# Patient Record
Sex: Female | Born: 1955 | Race: White | Hispanic: No | Marital: Married | Smoking: Never smoker
Health system: Southern US, Community
[De-identification: ages and names within clinical notes are randomized; demographics above are authoritative.]

## PROBLEM LIST (undated history)

## (undated) DIAGNOSIS — E079 Disorder of thyroid, unspecified: Secondary | ICD-10-CM

## (undated) HISTORY — PX: APPENDECTOMY: SHX54

## (undated) HISTORY — DX: Disorder of thyroid, unspecified: E07.9

## (undated) HISTORY — PX: CHOLECYSTECTOMY: SHX55

---

## 2020-08-22 ENCOUNTER — Emergency Department (HOSPITAL_BASED_OUTPATIENT_CLINIC_OR_DEPARTMENT_OTHER): Payer: BC Managed Care – PPO

## 2020-08-22 ENCOUNTER — Other Ambulatory Visit: Payer: Self-pay

## 2020-08-22 ENCOUNTER — Encounter (HOSPITAL_BASED_OUTPATIENT_CLINIC_OR_DEPARTMENT_OTHER): Payer: Self-pay | Admitting: *Deleted

## 2020-08-22 ENCOUNTER — Emergency Department (HOSPITAL_BASED_OUTPATIENT_CLINIC_OR_DEPARTMENT_OTHER)
Admission: EM | Admit: 2020-08-22 | Discharge: 2020-08-22 | Disposition: A | Discharge status: Home / Self Care | Payer: BC Managed Care – PPO | Attending: Emergency Medicine | Admitting: Emergency Medicine

## 2020-08-22 DIAGNOSIS — U071 COVID-19: Secondary | ICD-10-CM | POA: Insufficient documentation

## 2020-08-22 DIAGNOSIS — J1282 Pneumonia due to coronavirus disease 2019: Secondary | ICD-10-CM | POA: Insufficient documentation

## 2020-08-22 DIAGNOSIS — R0602 Shortness of breath: Secondary | ICD-10-CM | POA: Diagnosis present

## 2020-08-22 NOTE — ED Triage Notes (Signed)
Pt reports + covid test last week, states she presents today "to get my lungs checked". Pt denies sob, reports dry cough, and "hiccoughs" intermittently. Rt at triage to assess.

## 2020-08-22 NOTE — ED Notes (Signed)
ED Provider at bedside. 

## 2020-08-22 NOTE — ED Provider Notes (Signed)
[facility] EMERGENCY DEPARTMENT Provider Note   CSN: [id number] Arrival date & time: 08/22/20  0941     History Chief Complaint  Patient presents with  . Shortness of Breath    Sara Ayala is a 64 y.o. female.  64 yo F with a chief complaints of shortness of breath.  Patient states that when she tries to take a deep inhale she feels like she has trouble.  She was diagnosed with the coronavirus about 2 weeks ago.  Estimates she had symptoms about 3 days before that.  She has had persistent fevers and cough.  Is also had some diarrhea off and on.  No continued nausea or vomiting.  No abdominal pain.  The history is provided by the patient.  Shortness of Breath Severity:  Moderate Onset quality:  Gradual Duration:  2 weeks Timing:  Constant Progression:  Worsening Chronicity:  New Relieved by:  Nothing Worsened by:  Nothing Ineffective treatments:  None tried Associated symptoms: cough and fever   Associated symptoms: no chest pain, no headaches, no vomiting and no wheezing        Past Medical History:  Diagnosis Date  . Thyroid disease     There are no problems to display for this patient.   Past Surgical History:  Procedure Laterality Date  . APPENDECTOMY    . CHOLECYSTECTOMY       OB History   No obstetric history on file.     No family history on file.  Social History   Tobacco Use  . Smoking status: Never Smoker  . Smokeless tobacco: Never Used  Substance Use Topics  . Alcohol use: Not on file  . Drug use: Not on file    Home Medications Prior to Admission medications   Not on File    Allergies    Patient has no known allergies.  Review of Systems   Review of Systems  Constitutional: Positive for chills and fever.  HENT: Negative for congestion and rhinorrhea.   Eyes: Negative for redness and visual disturbance.  Respiratory: Positive for cough and shortness of breath. Negative for wheezing.   Cardiovascular: Negative for  chest pain and palpitations.  Gastrointestinal: Positive for diarrhea. Negative for nausea and vomiting.  Genitourinary: Negative for dysuria and urgency.  Musculoskeletal: Negative for arthralgias and myalgias.  Skin: Negative for pallor and wound.  Neurological: Negative for dizziness and headaches.    Physical Exam Updated Vital Signs BP (!) 141/118 (BP Location: Left Arm)   Pulse (!) 107   Temp 98.7 F (37.1 C) (Oral)   Resp 18   Ht 5\' 2"  (1.575 m)   Wt 69.4 kg   SpO2 99%   BMI 27.98 kg/m   Physical Exam Vitals and nursing note reviewed.  Constitutional:      General: She is not in acute distress.    Appearance: She is well-developed. She is not diaphoretic.  HENT:     Head: Normocephalic and atraumatic.  Eyes:     Pupils: Pupils are equal, round, and reactive to light.  Cardiovascular:     Rate and Rhythm: Normal rate and regular rhythm.     Heart sounds: No murmur heard.  No friction rub. No gallop.   Pulmonary:     Effort: Pulmonary effort is normal.     Breath sounds: No wheezing or rales.  Abdominal:     General: There is no distension.     Palpations: Abdomen is soft.     Tenderness: There  is no abdominal tenderness.     Comments: Benign abdominal exam  Musculoskeletal:        General: No tenderness.     Cervical back: Normal range of motion and neck supple.  Skin:    General: Skin is warm and dry.  Neurological:     Mental Status: She is alert and oriented to person, place, and time.  Psychiatric:        Behavior: Behavior normal.     ED Results / Procedures / Treatments   Labs (all labs ordered are listed, but only abnormal results are displayed) Labs Reviewed - No data to display  EKG None  Radiology DG [facility] 1 View  Result Date: 08/22/2020 CLINICAL DATA:  Shortness of breath, COVID positive. EXAM: PORTABLE CHEST 1 VIEW COMPARISON:  None. FINDINGS: Patchy airspace opacities in the right lung with a peripheral predominance, most  conspicuous in the upper lung. Low lung volumes. No pleural effusions. No pneumothorax. Cardiac and mediastinal silhouette is within normal limits. No acute osseous abnormality. IMPRESSION: Patchy opacities in the right lung, concerning for pneumonia. Recommend follow-up to resolution. Electronically Signed   By: Feliberto Harts MD   On: 08/22/2020 11:19    Procedures Procedures (including critical care time)  Medications Ordered in ED Medications - No data to display  ED Course  I have reviewed the triage vital signs and the nursing notes.  Pertinent labs & imaging results that were available during my care of the patient were reviewed by me and considered in my medical decision making (see chart for details).    MDM Rules/Calculators/A&P                          64 yo F with a chief complaints of shortness of breath.  Patient was diagnosed with the novel coronavirus about 2 weeks ago and has had symptoms now for about 17 or 18 days.  She is well-appearing and nontoxic.  No tachypnea.  Was able to ambulate without hypoxia.  Will obtain a plain film of the chest.  PCP follow-up.  Sara Ayala was evaluated in Emergency Department on 08/22/2020 for the symptoms described in the history of present illness. He/she was evaluated in the context of the global COVID-19 pandemic, which necessitated consideration that the patient might be at risk for infection with the SARS-CoV-2 virus that causes COVID-19. Institutional protocols and algorithms that pertain to the evaluation of patients at risk for COVID-19 are in a state of rapid change based on information released by regulatory bodies including the CDC and federal and state organizations. These policies and algorithms were followed during the patient's care in the ED.  11:34 AM:  I have discussed the diagnosis/risks/treatment options with the patient and believe the pt to be eligible for discharge home to follow-up with PCP. We also discussed  returning to the ED immediately if new or worsening sx occur. We discussed the sx which are most concerning (e.g., sudden worsening pain, fever, inability to tolerate by mouth) that necessitate immediate return. Medications administered to the patient during their visit and any new prescriptions provided to the patient are listed below.  Medications given during this visit Medications - No data to display   The patient appears reasonably screen and/or stabilized for discharge and I doubt any other medical condition or other [facility] requiring further screening, evaluation, or treatment in the ED at this time prior to discharge.   Final Clinical Impression(s) /  ED Diagnoses Final diagnoses:  Pneumonia due to COVID-19 virus    Rx / DC Orders ED Discharge Orders    None       Melene Plan, DO 08/22/20 1134

## 2020-08-22 NOTE — Discharge Instructions (Signed)
Your chest x-ray showed that you have infiltrates that are consistent with the coronavirus.  Please follow-up with your family doctor.  Please return to the ED for worsening difficulty breathing.

## 2020-08-22 NOTE — ED Notes (Signed)
Patient ambulated form lobby to triage.  SpO2 98-100% on r/a,HR 110-115, RR 20s.  Shallow respirations

## 2022-02-02 IMAGING — DX DG CHEST 1V PORT
1 series · 1 of 1 positions shown · non-contrast
Comparison: None.

CLINICAL DATA: Shortness of breath, COVID positive.

EXAM:
PORTABLE CHEST 1 VIEW

[chest ap]
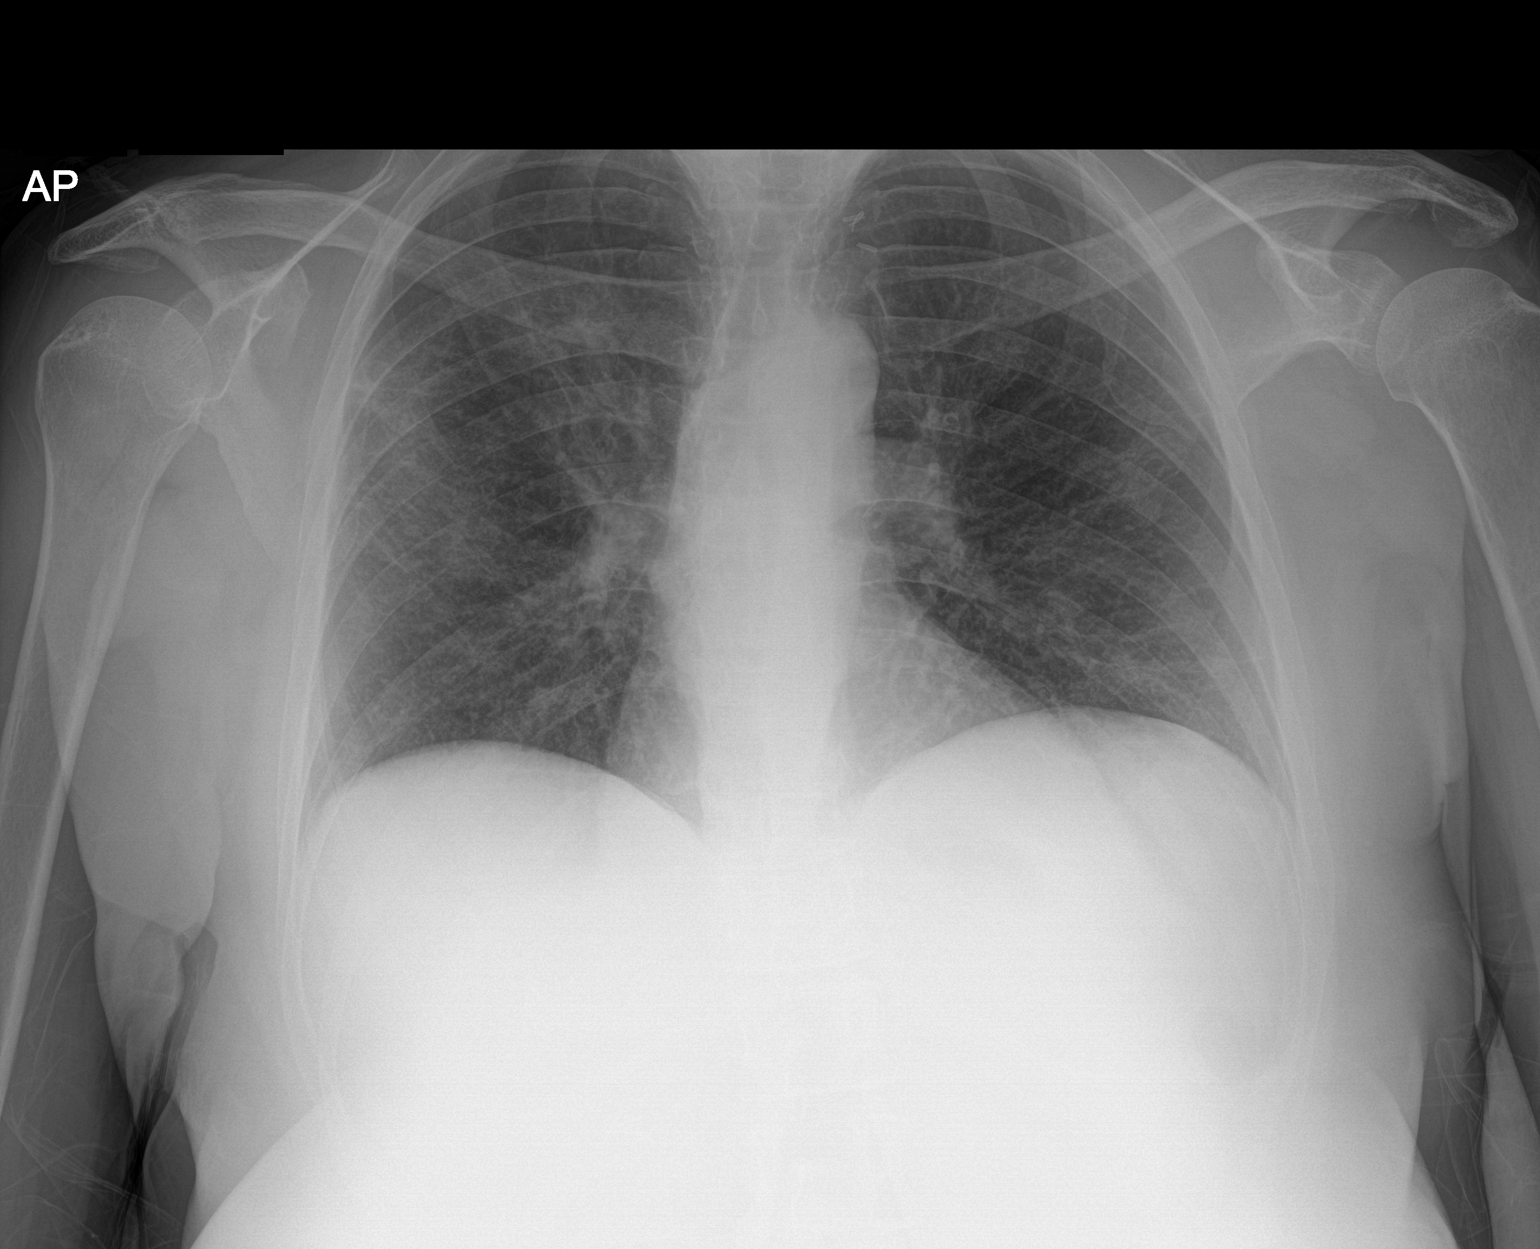

[1 of 1 positions shown; findings below may reference images not displayed]

FINDINGS: Patchy airspace opacities in the right lung with a peripheral
predominance, most conspicuous in the upper lung. Low lung volumes.
No pleural effusions. No pneumothorax. Cardiac and mediastinal
silhouette is within normal limits. No acute osseous abnormality.
IMPRESSION: Patchy opacities in the right lung, concerning for pneumonia.
Recommend follow-up to resolution.
